# Patient Record
Sex: Male | Born: 1965 | Race: White | Hispanic: No | Marital: Single | Smoking: Current every day smoker
Health system: Southern US, Community
[De-identification: ages and names within clinical notes are randomized; demographics above are authoritative.]

## PROBLEM LIST (undated history)

## (undated) DIAGNOSIS — I82409 Acute embolism and thrombosis of unspecified deep veins of unspecified lower extremity: Secondary | ICD-10-CM

## (undated) DIAGNOSIS — I2699 Other pulmonary embolism without acute cor pulmonale: Secondary | ICD-10-CM

## (undated) DIAGNOSIS — I82441 Acute embolism and thrombosis of right tibial vein: Secondary | ICD-10-CM

## (undated) DIAGNOSIS — K5792 Diverticulitis of intestine, part unspecified, without perforation or abscess without bleeding: Secondary | ICD-10-CM

## (undated) DIAGNOSIS — R079 Chest pain, unspecified: Secondary | ICD-10-CM

## (undated) HISTORY — DX: Diverticulitis of intestine, part unspecified, without perforation or abscess without bleeding: K57.92

## (undated) HISTORY — PX: KNEE SURGERY: SHX244

## (undated) HISTORY — PX: COLON SURGERY: SHX602

---
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
## 2009-08-13 ENCOUNTER — Emergency Department (HOSPITAL_BASED_OUTPATIENT_CLINIC_OR_DEPARTMENT_OTHER): Admission: EM | Admit: 2009-08-13 | Discharge: 2009-08-13 | Payer: Self-pay | Admitting: Emergency Medicine

## 2010-08-29 ENCOUNTER — Telehealth: Payer: Self-pay | Admitting: *Deleted

## 2010-08-29 NOTE — Telephone Encounter (Signed)
Pt calling to make appt for cpx with Dr. Margaretha Glassing he has not been seen in a while.  Pt stated he is going out of town and can not come in to be seen until next week.  Pt stated he also needs to be seen for left arm numbness related to nerve damage.  The patient experienced right side facial tension on Tuesday 08/28/10 and also what to have that looked at.  Consulted with triage nurse and was told to advise pt that his symptoms could be associated with stroke and that he should be seen immediately.  Pt declined stating he has to go out of town and can not come in until he returns.  Appt schedule for cpx labs on 09/03/10 and cpx on 09/06/10.

## 2010-09-03 ENCOUNTER — Other Ambulatory Visit: Payer: Self-pay

## 2010-09-06 ENCOUNTER — Encounter: Payer: Self-pay | Admitting: Internal Medicine

## 2010-09-19 ENCOUNTER — Other Ambulatory Visit: Payer: Self-pay

## 2010-10-01 ENCOUNTER — Encounter: Payer: Self-pay | Admitting: Internal Medicine

## 2010-10-01 ENCOUNTER — Telehealth: Payer: Self-pay

## 2010-10-01 DIAGNOSIS — Z0289 Encounter for other administrative examinations: Secondary | ICD-10-CM

## 2010-10-01 NOTE — Telephone Encounter (Signed)
OK to charge for NS

## 2010-10-01 NOTE — Telephone Encounter (Signed)
noted 

## 2010-10-01 NOTE — Telephone Encounter (Signed)
NO CALL NO SHOW for a cpx - that was r/s by pt in June -. I dont see any records in centricity or epic as to when he was last seen by Dr. Amador Cunas. Please advise if he needs to be placed on no reschedule list.

## 2011-11-13 ENCOUNTER — Encounter: Payer: Self-pay | Admitting: Internal Medicine

## 2012-07-13 ENCOUNTER — Encounter: Payer: Self-pay | Admitting: Internal Medicine

## 2012-07-13 ENCOUNTER — Ambulatory Visit (INDEPENDENT_AMBULATORY_CARE_PROVIDER_SITE_OTHER): Payer: BC Managed Care – PPO | Admitting: Internal Medicine

## 2012-07-13 VITALS — BP 156/90 | HR 86 | Temp 98.6°F | Resp 20 | Ht 70.0 in | Wt 233.0 lb

## 2012-07-13 DIAGNOSIS — G5620 Lesion of ulnar nerve, unspecified upper limb: Secondary | ICD-10-CM

## 2012-07-13 DIAGNOSIS — Z720 Tobacco use: Secondary | ICD-10-CM

## 2012-07-13 DIAGNOSIS — G5622 Lesion of ulnar nerve, left upper limb: Secondary | ICD-10-CM

## 2012-07-13 DIAGNOSIS — G5600 Carpal tunnel syndrome, unspecified upper limb: Secondary | ICD-10-CM | POA: Insufficient documentation

## 2012-07-13 DIAGNOSIS — Z0000 Encounter for general adult medical examination without abnormal findings: Secondary | ICD-10-CM

## 2012-07-13 DIAGNOSIS — K148 Other diseases of tongue: Secondary | ICD-10-CM

## 2012-07-13 DIAGNOSIS — F17200 Nicotine dependence, unspecified, uncomplicated: Secondary | ICD-10-CM

## 2012-07-13 DIAGNOSIS — K5730 Diverticulosis of large intestine without perforation or abscess without bleeding: Secondary | ICD-10-CM | POA: Insufficient documentation

## 2012-07-13 LAB — CBC WITH DIFFERENTIAL/PLATELET
Basophils Absolute: 0 10*3/uL (ref 0.0–0.1)
Eosinophils Absolute: 0.1 10*3/uL (ref 0.0–0.7)
Hemoglobin: 13.9 g/dL (ref 13.0–17.0)
Lymphocytes Relative: 37.2 % (ref 12.0–46.0)
MCHC: 33.1 g/dL (ref 30.0–36.0)
Neutro Abs: 5.5 10*3/uL (ref 1.4–7.7)
Platelets: 288 10*3/uL (ref 150.0–400.0)
RDW: 13.6 % (ref 11.5–14.6)

## 2012-07-13 LAB — LIPID PANEL
Total CHOL/HDL Ratio: 7
VLDL: 44.8 mg/dL — ABNORMAL HIGH (ref 0.0–40.0)

## 2012-07-13 LAB — COMPREHENSIVE METABOLIC PANEL
ALT: 36 U/L (ref 0–53)
AST: 25 U/L (ref 0–37)
Albumin: 4.1 g/dL (ref 3.5–5.2)
Calcium: 8.8 mg/dL (ref 8.4–10.5)
Chloride: 105 mEq/L (ref 96–112)
Potassium: 3.8 mEq/L (ref 3.5–5.1)
Sodium: 138 mEq/L (ref 135–145)
Total Protein: 6.8 g/dL (ref 6.0–8.3)

## 2012-07-13 NOTE — Progress Notes (Signed)
Subjective:    Patient ID: Derek Stevenson, male    DOB: [birth year], 47 y.o.   MRN: [id number]  HPI 47 year old patient who is seen today to reestablish with our practice. His chief complaint is positional vertigo that has been problematic for the past several days. He also describes a numbness involving the ulnar distribution of his left hand for the past 3 years there's been no loss of grip strength. He also describes a some occasional discomfort and paresthesias involving the entire left arm. He also describes symptoms compatible with carpal tunnel syndrome when he frequently awakes from sleep with numbness involving both hands. This has been a problem for approximately 6 years. He's also been evaluated by oral surgery for a lesion involving his tongue is been there for approximately 4 years. He has a 20 year smoking history one half pack per day. He describes increasing stress and lack of energy.  Family history. Pants are followed in this practice father has history of diabetes hypertension and cerebrovascular disease. Social history divorced; IT ; one half pack per day smoker  History reviewed. No pertinent past medical history.  History   Social History  . Marital Status: Single    Spouse Name: N/A    Number of Children: N/A  . Years of Education: N/A   Occupational History  . Not on file.   Social History Main Topics  . Smoking status: Current Every Day Smoker    Types: Cigarettes  . Smokeless tobacco: Never Used  . Alcohol Use: 9.0 oz/week    15 Shots of liquor per week  . Drug Use: No  . Sexually Active: Not on file   Other Topics Concern  . Not on file   Social History Narrative  . No narrative on file    History reviewed. No pertinent past surgical history.  No family history on file.  Allergies  Allergen Reactions  . Penicillins Other (See Comments)    Unknown at birth    No current outpatient prescriptions on file prior to visit.   No current  facility-administered medications on file prior to visit.    BP 156/90  Pulse 86  Temp(Src) 98.6 F (37 C) (Oral)  Resp 20  Ht 5\' 10"  (1.778 m)  Wt 233 lb (105.688 kg)  BMI 33.43 kg/m2  SpO2 97%       Review of Systems  Constitutional: Positive for fatigue. Negative for fever, chills, activity change and appetite change.  HENT: Negative for hearing loss, ear pain, congestion, rhinorrhea, sneezing, mouth sores, trouble swallowing, neck pain, neck stiffness, dental problem, voice change, sinus pressure and tinnitus.   Eyes: Negative for photophobia, pain, redness and visual disturbance.  Respiratory: Negative for apnea, cough, choking, chest tightness, shortness of breath and wheezing.   Cardiovascular: Negative for chest pain, palpitations and leg swelling.  Gastrointestinal: Negative for nausea, vomiting, abdominal pain, diarrhea, constipation, blood in stool, abdominal distention, anal bleeding and rectal pain.  Genitourinary: Negative for dysuria, urgency, frequency, hematuria, flank pain, decreased urine volume, discharge, penile swelling, scrotal swelling, difficulty urinating, genital sores and testicular pain.  Musculoskeletal: Negative for myalgias, back pain, joint swelling, arthralgias and gait problem.  Skin: Negative for color change, rash and wound.  Neurological: Positive for dizziness and numbness. Negative for tremors, seizures, syncope, facial asymmetry, speech difficulty, weakness, light-headedness and headaches.  Hematological: Negative for adenopathy. Does not bruise/bleed easily.  Psychiatric/Behavioral: Negative for suicidal ideas, hallucinations, behavioral problems, confusion, sleep disturbance, self-injury, dysphoric mood, decreased concentration and  agitation. The patient is nervous/anxious.        Objective:   Physical Exam  Constitutional: He appears well-developed and well-nourished.  HENT:  Head: Normocephalic and atraumatic.  Right Ear: External  ear normal.  Left Ear: External ear normal.  Nose: Nose normal.  Mouth/Throat: Oropharynx is clear and moist.  Eyes: Conjunctivae and EOM are normal. Pupils are equal, round, and reactive to light. No scleral icterus.  Neck: Normal range of motion. Neck supple. No JVD present. No thyromegaly present.  Cardiovascular: Regular rhythm, normal heart sounds and intact distal pulses.  Exam reveals no gallop and no friction rub.   No murmur heard. Pulmonary/Chest: Effort normal and breath sounds normal. He exhibits no tenderness.  Abdominal: Soft. Bowel sounds are normal. He exhibits no distension and no mass. There is no tenderness.  Genitourinary: Prostate normal and penis normal.  Musculoskeletal: Normal range of motion. He exhibits no edema and no tenderness.  Lymphadenopathy:    He has no cervical adenopathy.  Neurological: He is alert. He has normal reflexes. No cranial nerve deficit. Coordination normal.  Numbness involving the left fifth finger and lateral aspect of the left fourth finger  Negative Tinel's  Skin: Skin is warm and dry. No rash noted.  Psychiatric: He has a normal mood and affect. His behavior is normal.          Assessment & Plan:   Benign positional vertigo. Information dispensed as well as repositioning exercises Tobacco abuse Fatigue/situational stress. Laboratory studies will be obtained Numbness left hand in the ulnar distribution; possible cervical radiculopathy Probable bilateral carpal tunnel syndrome Exogenous obesity

## 2012-07-13 NOTE — Patient Instructions (Signed)
Smoking tobacco is very bad for your health. You should stop smoking immediately.    It is important that you exercise regularly, at least 20 minutes 3 to 4 times per week.  If you develop chest pain or shortness of breath seek  medical attention.  You need to lose weight.  Consider a lower calorie diet and regular exercise.  Benign Positional Vertigo Vertigo means you feel like you or your surroundings are moving when they are not. Benign positional vertigo is the most common form of vertigo. Benign means that the cause of your condition is not serious. Benign positional vertigo is more common in older adults. CAUSES  Benign positional vertigo is the result of an upset in the labyrinth system. This is an area in the middle ear that helps control your balance. This may be caused by a viral infection, head injury, or repetitive motion. However, often no specific cause is found. SYMPTOMS  Symptoms of benign positional vertigo occur when you move your head or eyes in different directions. Some of the symptoms may include:  Loss of balance and falls.  Vomiting.  Blurred vision.  Dizziness.  Nausea.  Involuntary eye movements (nystagmus). DIAGNOSIS  Benign positional vertigo is usually diagnosed by physical exam. If the specific cause of your benign positional vertigo is unknown, your caregiver may perform imaging tests, such as magnetic resonance imaging (MRI) or computed tomography (CT). TREATMENT  Your caregiver may recommend movements or procedures to correct the benign positional vertigo. Medicines such as meclizine, benzodiazepines, and medicines for nausea may be used to treat your symptoms. In rare cases, if your symptoms are caused by certain conditions that affect the inner ear, you may need surgery. HOME CARE INSTRUCTIONS   Follow your caregiver's instructions.  Move slowly. Do not make sudden body or head movements.  Avoid driving.  Avoid operating heavy machinery.  Avoid  performing any tasks that would be dangerous to you or others during a vertigo episode.  Drink enough fluids to keep your urine clear or pale yellow. SEEK IMMEDIATE MEDICAL CARE IF:   You develop problems with walking, weakness, numbness, or using your arms, hands, or legs.  You have difficulty speaking.  You develop severe headaches.  Your nausea or vomiting continues or gets worse.  You develop visual changes.  Your family or friends notice any behavioral changes.  Your condition gets worse.  You have a fever.  You develop a stiff neck or sensitivity to light. MAKE SURE YOU:   Understand these instructions.  Will watch your condition.  Will get help right away if you are not doing well or get worse. Document Released: 12/17/2005 Document Revised: 06/03/2011 Document Reviewed: 11/29/2010 [facility] Patient Information 2013 [place], [place]. Carpal Tunnel Syndrome You may have carpal tunnel syndrome. This is a common condition. Carpal tunnel syndrome occurs when the tendons, bones, or ligaments in the wrist press against the median nerve as it passes into the hand.  Symptoms can include:  Intermittent numbness.   Pain or a tingling sensation in thumb and first two fingers.  The pain may radiate up to the shoulder. There may even be weakness in the hand muscles. The pain is often worse at night and in the early morning. Nerve conduction tests may be used to prove the diagnosis. Carpal tunnel syndrome is most often due to repeated movements of the hand or wrist. Other causes can include:  Prior injuries.   Diabetes.   Obesity.   Smoking.   Pregnancy. Symptoms  that develop during pregnancy often stop when the pregnancy is over.  Treatment includes:  Splinting - A wrist splint helps prevent movements that irritate the nerve. Splints are especially helpful at night when the symptoms are often worse.   Ice packs - Cold packs applied to the palm side of the wrist for 20  minutes every 2 hours while awake may give some relief.   Medication - Medicine to reduce inflammation and pain are often used. Cortisone injections around the nerve may also bring improvement.  Severe cases of carpal tunnel syndrome can require surgery to relieve the pressure on the nerve. This may be necessary if there is evidence of weakness or decreased sensation in your hand, or if your symptoms do not improve with conservative treatment. See your caregiver for follow-up to be certain your condition is improving. Document Released: 04/18/2004 Document Revised: 11/21/2010 Document Reviewed: 01/15/2007 [facility] Patient Information 2012 [place], [place].

## 2012-07-14 ENCOUNTER — Ambulatory Visit: Payer: Self-pay | Admitting: Internal Medicine

## 2012-08-05 ENCOUNTER — Telehealth: Payer: Self-pay | Admitting: Internal Medicine

## 2012-08-05 NOTE — Telephone Encounter (Signed)
Patient called stating that he would like a call with results. Please assist.

## 2012-08-06 NOTE — Telephone Encounter (Signed)
Left message on voicemail to call office.  

## 2012-08-06 NOTE — Telephone Encounter (Signed)
All okay except the TSH slightly suppressed.  We'll follow next visit. Cholesterol 184

## 2012-08-07 NOTE — Telephone Encounter (Signed)
Left detailed message labs are okay except TSH, thyroid slightly suppressed will discuss at follow up visit and Cholesterol was 184 per Dr.Kwiatkowski. Any questions call back.

## 2012-08-13 ENCOUNTER — Encounter: Payer: Self-pay | Admitting: Internal Medicine

## 2015-03-26 DIAGNOSIS — I82409 Acute embolism and thrombosis of unspecified deep veins of unspecified lower extremity: Secondary | ICD-10-CM

## 2015-03-26 DIAGNOSIS — I2699 Other pulmonary embolism without acute cor pulmonale: Secondary | ICD-10-CM

## 2015-03-26 HISTORY — DX: Other pulmonary embolism without acute cor pulmonale: I26.99

## 2015-03-26 HISTORY — DX: Acute embolism and thrombosis of unspecified deep veins of unspecified lower extremity: I82.409

## 2015-04-01 LAB — POCT INR: INR: 1.7

## 2015-10-24 DIAGNOSIS — S82141A Displaced bicondylar fracture of right tibia, initial encounter for closed fracture: Secondary | ICD-10-CM | POA: Insufficient documentation

## 2015-10-25 DIAGNOSIS — I2699 Other pulmonary embolism without acute cor pulmonale: Secondary | ICD-10-CM | POA: Insufficient documentation

## 2015-10-25 DIAGNOSIS — I82441 Acute embolism and thrombosis of right tibial vein: Secondary | ICD-10-CM | POA: Insufficient documentation

## 2015-10-25 HISTORY — DX: Other pulmonary embolism without acute cor pulmonale: I26.99

## 2015-10-25 HISTORY — DX: Acute embolism and thrombosis of right tibial vein: I82.441

## 2015-10-30 ENCOUNTER — Telehealth: Payer: Self-pay | Admitting: General Practice

## 2015-10-30 NOTE — Telephone Encounter (Signed)
Okay 
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
## 2015-10-31 NOTE — Telephone Encounter (Signed)
Pt has been scheduled.  °

## 2015-11-01 ENCOUNTER — Ambulatory Visit: Payer: Self-pay | Admitting: Adult Health

## 2015-11-02 ENCOUNTER — Emergency Department (HOSPITAL_COMMUNITY): Payer: BLUE CROSS/BLUE SHIELD

## 2015-11-02 ENCOUNTER — Encounter (HOSPITAL_COMMUNITY): Payer: Self-pay | Admitting: Emergency Medicine

## 2015-11-02 ENCOUNTER — Emergency Department (HOSPITAL_COMMUNITY)
Admission: EM | Admit: 2015-11-02 | Discharge: 2015-11-03 | Disposition: A | Discharge status: Home / Self Care | Payer: BLUE CROSS/BLUE SHIELD | Attending: Emergency Medicine | Admitting: Emergency Medicine

## 2015-11-02 DIAGNOSIS — R0600 Dyspnea, unspecified: Secondary | ICD-10-CM | POA: Insufficient documentation

## 2015-11-02 DIAGNOSIS — Z7901 Long term (current) use of anticoagulants: Secondary | ICD-10-CM | POA: Insufficient documentation

## 2015-11-02 DIAGNOSIS — F17210 Nicotine dependence, cigarettes, uncomplicated: Secondary | ICD-10-CM | POA: Insufficient documentation

## 2015-11-02 DIAGNOSIS — R066 Hiccough: Secondary | ICD-10-CM | POA: Insufficient documentation

## 2015-11-02 DIAGNOSIS — R0602 Shortness of breath: Secondary | ICD-10-CM | POA: Diagnosis present
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
## 2015-11-02 NOTE — ED Triage Notes (Signed)
Pt from home with c/o shortness of breat and hiccups x 1 week. [facility] Per EMS, pt has confirmed PE and DVT. Taking coumadin and lovenox, has not taken today. BP-137/81, HR-90, SpO2-97% ra

## 2015-11-03 ENCOUNTER — Encounter: Payer: Self-pay | Admitting: Internal Medicine

## 2015-11-03 ENCOUNTER — Ambulatory Visit (INDEPENDENT_AMBULATORY_CARE_PROVIDER_SITE_OTHER): Payer: BLUE CROSS/BLUE SHIELD | Admitting: Internal Medicine

## 2015-11-03 DIAGNOSIS — S82141A Displaced bicondylar fracture of right tibia, initial encounter for closed fracture: Secondary | ICD-10-CM

## 2015-11-03 DIAGNOSIS — S82141S Displaced bicondylar fracture of right tibia, sequela: Secondary | ICD-10-CM

## 2015-11-03 DIAGNOSIS — S82191S Other fracture of upper end of right tibia, sequela: Secondary | ICD-10-CM | POA: Diagnosis not present

## 2015-11-03 DIAGNOSIS — R799 Abnormal finding of blood chemistry, unspecified: Secondary | ICD-10-CM

## 2015-11-03 DIAGNOSIS — R739 Hyperglycemia, unspecified: Secondary | ICD-10-CM

## 2015-11-03 DIAGNOSIS — I2699 Other pulmonary embolism without acute cor pulmonale: Secondary | ICD-10-CM

## 2015-11-03 LAB — PROTIME-INR
INR: 1.27
PROTHROMBIN TIME: 16 s — AB (ref 11.4–15.2)

## 2015-11-03 LAB — TROPONIN I

## 2015-11-03 MED ORDER — METOCLOPRAMIDE HCL 5 MG/ML IJ SOLN
10.0000 mg | Freq: Once | INTRAMUSCULAR | Status: AC
  Administered 2015-11-03: 10 mg via INTRAVENOUS
  Filled 2015-11-03: qty 2

## 2015-11-03 MED ORDER — ENOXAPARIN SODIUM 100 MG/ML ~~LOC~~ SOLN
1.0000 mg/kg | Freq: Once | SUBCUTANEOUS | Status: AC
  Administered 2015-11-03: 100 mg via SUBCUTANEOUS
  Filled 2015-11-03: qty 1

## 2015-11-03 MED ORDER — DIPHENHYDRAMINE HCL 50 MG/ML IJ SOLN
25.0000 mg | Freq: Once | INTRAMUSCULAR | Status: AC
  Administered 2015-11-03: 25 mg via INTRAVENOUS
  Filled 2015-11-03: qty 1

## 2015-11-03 MED ORDER — ONDANSETRON 4 MG PO TBDP
8.0000 mg | ORAL_TABLET | Freq: Once | ORAL | Status: AC
  Administered 2015-11-03: 8 mg via ORAL
  Filled 2015-11-03: qty 2

## 2015-11-03 MED ORDER — METOCLOPRAMIDE HCL 10 MG PO TABS: 10.0000 mg | ORAL_TABLET | Freq: Three times a day (TID) | ORAL | 0 refills | Status: DC | PRN
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
## 2015-11-03 NOTE — ED Notes (Addendum)
Pt c/o hiccups at this time. Dr. Bebe ShaggyWickline notified.

## 2015-11-03 NOTE — Patient Instructions (Signed)
Continue Lovenox injections twice daily Coumadin 7.5 milligrams today (1 and a half tablets), then resume 5 mg daily  Coumadin clinic on Wednesday of next week  Return in one month for follow-up  Orthopedic follow-up as scheduled

## 2015-11-08 ENCOUNTER — Ambulatory Visit (INDEPENDENT_AMBULATORY_CARE_PROVIDER_SITE_OTHER): Payer: BLUE CROSS/BLUE SHIELD | Admitting: General Practice

## 2015-11-08 DIAGNOSIS — Z7901 Long term (current) use of anticoagulants: Secondary | ICD-10-CM

## 2015-11-08 DIAGNOSIS — Z5181 Encounter for therapeutic drug level monitoring: Secondary | ICD-10-CM | POA: Insufficient documentation

## 2015-11-08 LAB — POCT INR: INR: 2

## 2015-11-08 NOTE — Patient Instructions (Signed)
Pre visit review using our clinic review tool, if applicable. No additional management support is needed unless otherwise documented below in the visit note. A full discussion of the nature of anticoagulants has been carried out.  A benefit risk analysis has been presented to the patient, so that they understand the justification for choosing anticoagulation at this time. The need for frequent and regular monitoring, precise dosage adjustment and compliance is stressed.  Side effects of potential bleeding are discussed.  The patient should avoid any OTC items containing aspirin or ibuprofen, and should avoid great swings in general diet.  Avoid alcohol consumption.  Call if any signs of abnormal bleeding.    

## 2015-11-15 ENCOUNTER — Ambulatory Visit (INDEPENDENT_AMBULATORY_CARE_PROVIDER_SITE_OTHER): Payer: BLUE CROSS/BLUE SHIELD | Admitting: General Practice

## 2015-11-15 DIAGNOSIS — Z5181 Encounter for therapeutic drug level monitoring: Secondary | ICD-10-CM | POA: Diagnosis not present

## 2015-11-15 DIAGNOSIS — Z7901 Long term (current) use of anticoagulants: Secondary | ICD-10-CM | POA: Diagnosis not present

## 2015-11-22 ENCOUNTER — Telehealth: Payer: Self-pay | Admitting: Internal Medicine

## 2015-11-22 MED ORDER — WARFARIN SODIUM 5 MG PO TABS: 5.0000 mg | ORAL_TABLET | Freq: Every day | ORAL | 0 refills | Status: DC

## 2015-11-22 NOTE — Telephone Encounter (Signed)
Spoke to pt, told him Rx refill for Coumadin was sent to pharmacy. Please follow directions Coumadin Clinic gave you and when you have your next appt please tell [facility] you need refills. Pt verbalized understanding.

## 2015-11-22 NOTE — Telephone Encounter (Signed)
Researched pt's last provider visit on 8/11 and [facility] and pt is to continue Coumadin as directed. Pt does have follow up appointments. Rx refill for Coumadin sent to pharmacy.

## 2015-11-22 NOTE — Telephone Encounter (Signed)
Pt needs refill on coumadin 5 mg #30 send to [facility] [address]. This med was originally prescribed by md at [facility]

## 2015-11-29 ENCOUNTER — Ambulatory Visit: Payer: BLUE CROSS/BLUE SHIELD | Admitting: General Practice

## 2015-11-29 DIAGNOSIS — Z5181 Encounter for therapeutic drug level monitoring: Secondary | ICD-10-CM

## 2015-11-29 DIAGNOSIS — Z7901 Long term (current) use of anticoagulants: Secondary | ICD-10-CM

## 2015-11-29 LAB — POCT INR: INR: 1.8

## 2015-12-01 ENCOUNTER — Ambulatory Visit (INDEPENDENT_AMBULATORY_CARE_PROVIDER_SITE_OTHER): Payer: BLUE CROSS/BLUE SHIELD | Admitting: Internal Medicine

## 2015-12-01 ENCOUNTER — Encounter: Payer: Self-pay | Admitting: Internal Medicine

## 2015-12-01 DIAGNOSIS — S82141D Displaced bicondylar fracture of right tibia, subsequent encounter for closed fracture with routine healing: Secondary | ICD-10-CM

## 2015-12-01 DIAGNOSIS — I2699 Other pulmonary embolism without acute cor pulmonale: Secondary | ICD-10-CM

## 2015-12-01 DIAGNOSIS — R799 Abnormal finding of blood chemistry, unspecified: Secondary | ICD-10-CM

## 2015-12-01 DIAGNOSIS — S82191D Other fracture of upper end of right tibia, subsequent encounter for closed fracture with routine healing: Secondary | ICD-10-CM

## 2015-12-01 DIAGNOSIS — R739 Hyperglycemia, unspecified: Secondary | ICD-10-CM

## 2015-12-01 LAB — POCT GLUCOSE (DEVICE FOR HOME USE): POC GLUCOSE: 127 mg/dL — AB (ref 70–99)
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
## 2015-12-01 NOTE — Progress Notes (Signed)
Pre visit review using our clinic review tool, if applicable. No additional management support is needed unless otherwise documented below in the visit note. 

## 2015-12-01 NOTE — Patient Instructions (Signed)
Return in 2 months for follow-up   Continue to monitor prothrombin time/ INR and continue Coumadin anticoagulation

## 2015-12-01 NOTE — Addendum Note (Signed)
Addended by: Jimmye NormanPHANOS, Diora Bellizzi J on: 12/01/2015 05:05 PM   Modules accepted: Orders

## 2015-12-18 ENCOUNTER — Other Ambulatory Visit: Payer: Self-pay | Admitting: Internal Medicine

## 2015-12-19 ENCOUNTER — Other Ambulatory Visit: Payer: Self-pay | Admitting: General Practice

## 2015-12-19 MED ORDER — WARFARIN SODIUM 5 MG PO TABS: 5.0000 mg | ORAL_TABLET | Freq: Every day | ORAL | 3 refills | Status: DC

## 2015-12-19 NOTE — Telephone Encounter (Signed)
Pt is out of his  warfarin (COUMADIN) 5 MG tablet  [facility]/ [address]  Pt has coum appt on wed (tomorrow) Please advise.

## 2015-12-20 ENCOUNTER — Ambulatory Visit (INDEPENDENT_AMBULATORY_CARE_PROVIDER_SITE_OTHER): Payer: BLUE CROSS/BLUE SHIELD | Admitting: General Practice

## 2015-12-20 DIAGNOSIS — Z7901 Long term (current) use of anticoagulants: Secondary | ICD-10-CM | POA: Diagnosis not present

## 2015-12-20 DIAGNOSIS — Z5181 Encounter for therapeutic drug level monitoring: Secondary | ICD-10-CM

## 2015-12-20 LAB — POCT INR: INR: 2.4

## 2016-01-17 ENCOUNTER — Ambulatory Visit (INDEPENDENT_AMBULATORY_CARE_PROVIDER_SITE_OTHER): Payer: BLUE CROSS/BLUE SHIELD | Admitting: General Practice

## 2016-01-17 DIAGNOSIS — Z5181 Encounter for therapeutic drug level monitoring: Secondary | ICD-10-CM

## 2016-01-17 LAB — POCT INR: INR: 1.9

## 2016-01-17 NOTE — Patient Instructions (Signed)
Pre visit review using our clinic review tool, if applicable. No additional management support is needed unless otherwise documented below in the visit note. 

## 2016-02-02 ENCOUNTER — Ambulatory Visit: Payer: BLUE CROSS/BLUE SHIELD | Admitting: Internal Medicine

## 2016-02-14 ENCOUNTER — Ambulatory Visit: Payer: BLUE CROSS/BLUE SHIELD

## 2016-02-14 ENCOUNTER — Ambulatory Visit (INDEPENDENT_AMBULATORY_CARE_PROVIDER_SITE_OTHER): Payer: BLUE CROSS/BLUE SHIELD | Admitting: General Practice

## 2016-02-14 DIAGNOSIS — Z5181 Encounter for therapeutic drug level monitoring: Secondary | ICD-10-CM

## 2016-02-14 LAB — POCT INR: INR: 1.3

## 2016-02-14 NOTE — Patient Instructions (Signed)
Pre visit review using our clinic review tool, if applicable. No additional management support is needed unless otherwise documented below in the visit note. 

## 2016-02-19 ENCOUNTER — Ambulatory Visit: Payer: Self-pay | Admitting: Internal Medicine

## 2016-02-19 DIAGNOSIS — Z0289 Encounter for other administrative examinations: Secondary | ICD-10-CM

## 2016-05-02 ENCOUNTER — Ambulatory Visit: Payer: Self-pay | Admitting: General Practice

## 2016-06-25 ENCOUNTER — Ambulatory Visit: Payer: BLUE CROSS/BLUE SHIELD | Admitting: Internal Medicine

## 2016-10-10 ENCOUNTER — Ambulatory Visit: Payer: BLUE CROSS/BLUE SHIELD | Admitting: Family Medicine

## 2016-10-16 ENCOUNTER — Ambulatory Visit: Payer: BLUE CROSS/BLUE SHIELD | Admitting: Internal Medicine

## 2017-09-02 IMAGING — CR DG CHEST 2V
2 series · 2 of 2 positions shown · non-contrast
Comparison: None.

CLINICAL DATA: Acute onset of shortness of breath and hiccups.
Initial encounter.

EXAM:
CHEST  2 VIEW

[chest lat]
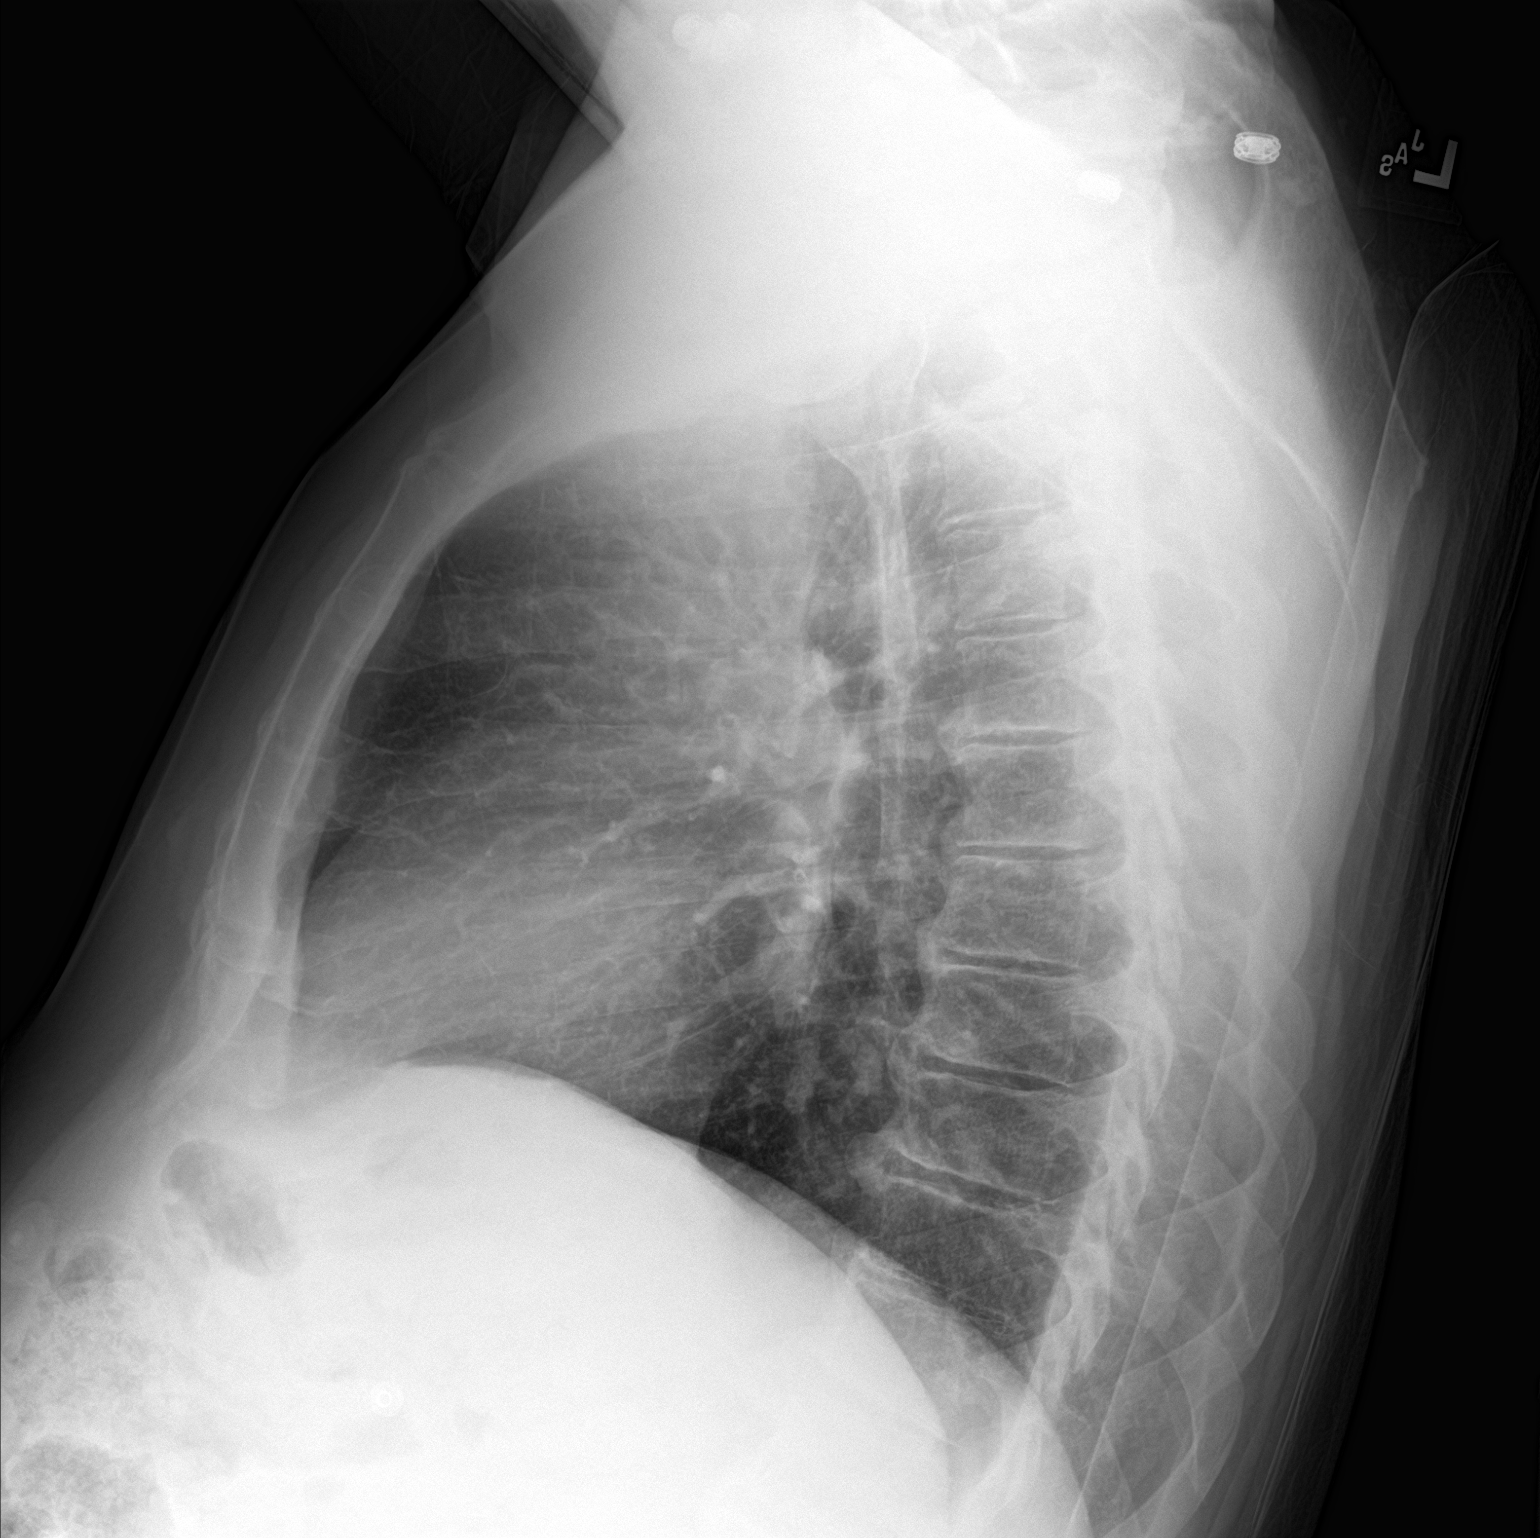

[chest ap]
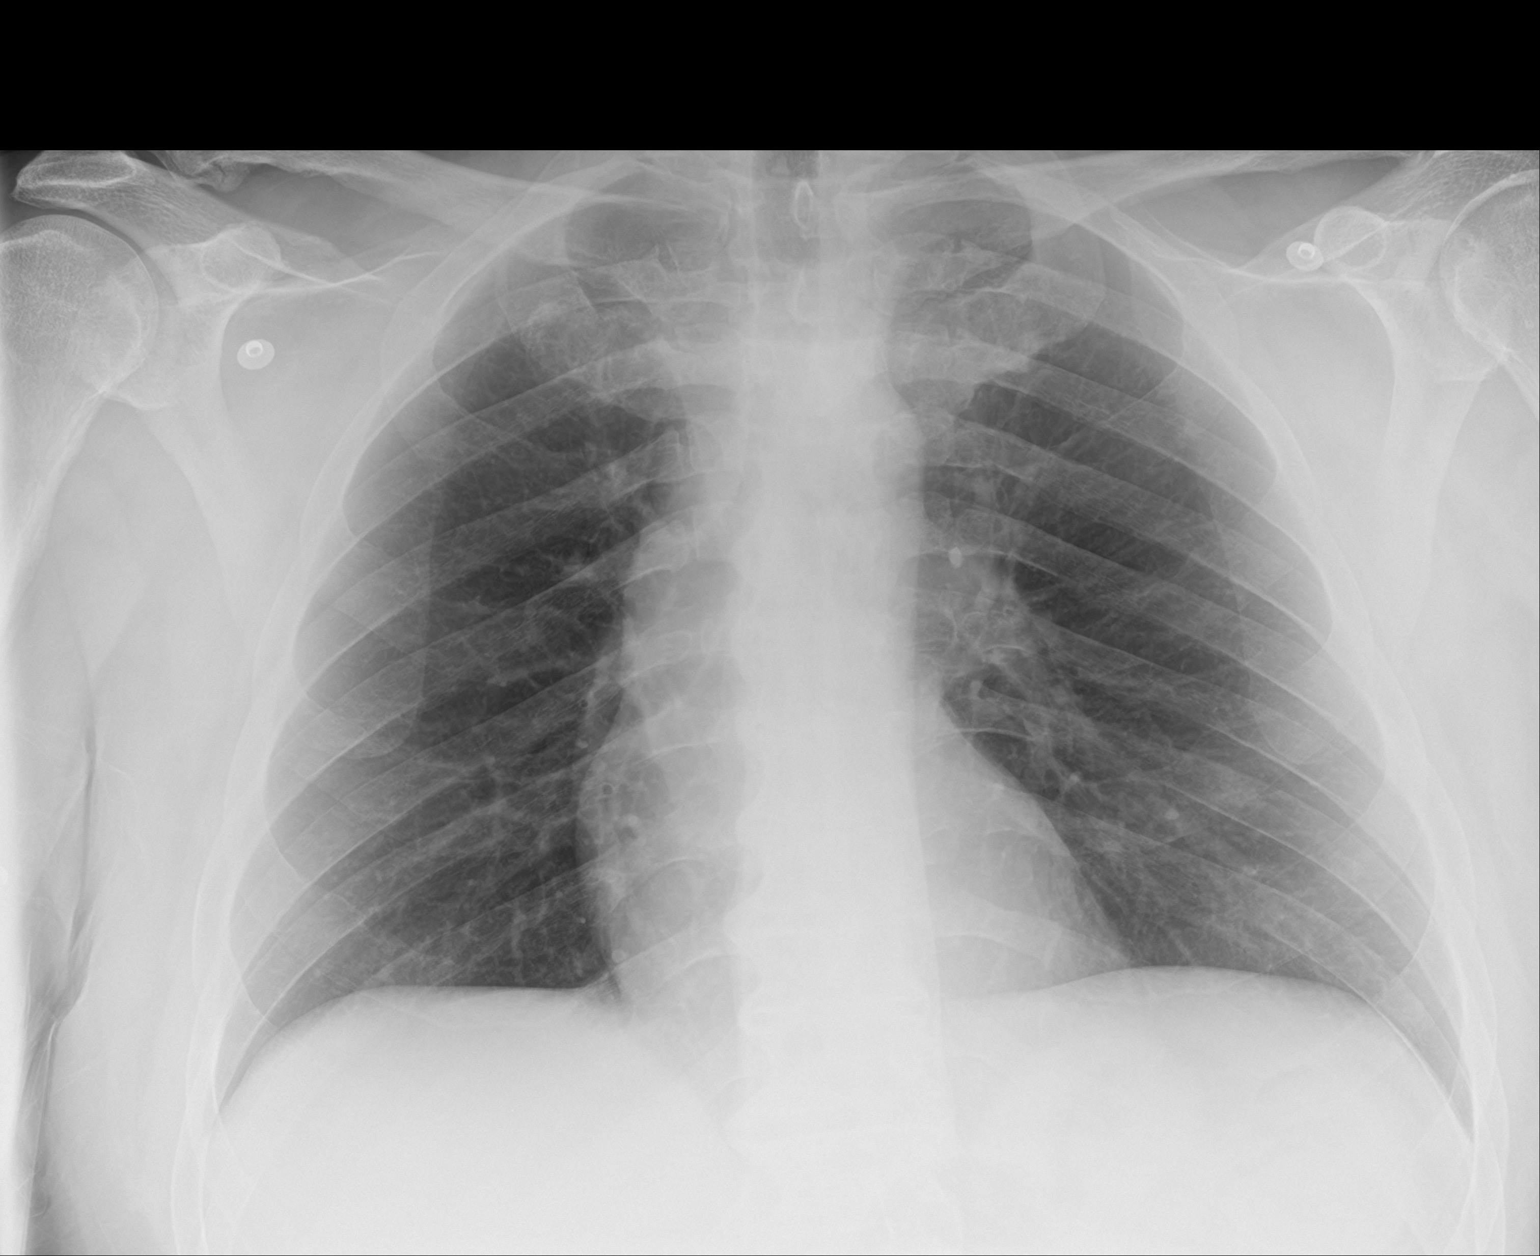

[2 of 2 positions shown; findings below may reference images not displayed]

FINDINGS: The lungs are well-aerated and clear. There is no evidence of focal
opacification, pleural effusion or pneumothorax.

The heart is normal in size; the mediastinal contour is within
normal limits. No acute osseous abnormalities are seen.
IMPRESSION: No acute cardiopulmonary process seen.

## 2018-12-14 ENCOUNTER — Other Ambulatory Visit: Payer: Self-pay

## 2018-12-14 ENCOUNTER — Encounter: Payer: Self-pay | Admitting: Medical

## 2018-12-14 ENCOUNTER — Ambulatory Visit: Payer: Managed Care, Other (non HMO) | Admitting: Medical

## 2018-12-14 ENCOUNTER — Telehealth: Payer: Self-pay | Admitting: Medical

## 2018-12-14 VITALS — BP 150/96 | HR 66 | Temp 98.1°F | Ht 71.0 in | Wt 211.6 lb

## 2018-12-14 DIAGNOSIS — R0789 Other chest pain: Secondary | ICD-10-CM | POA: Diagnosis not present

## 2018-12-14 DIAGNOSIS — R002 Palpitations: Secondary | ICD-10-CM | POA: Diagnosis not present

## 2018-12-14 DIAGNOSIS — F17200 Nicotine dependence, unspecified, uncomplicated: Secondary | ICD-10-CM | POA: Diagnosis not present

## 2018-12-14 DIAGNOSIS — I10 Essential (primary) hypertension: Secondary | ICD-10-CM

## 2018-12-14 DIAGNOSIS — R51 Headache: Secondary | ICD-10-CM | POA: Diagnosis not present

## 2018-12-14 DIAGNOSIS — R519 Headache, unspecified: Secondary | ICD-10-CM

## 2018-12-14 DIAGNOSIS — Z86718 Personal history of other venous thrombosis and embolism: Secondary | ICD-10-CM

## 2018-12-14 DIAGNOSIS — Z86711 Personal history of pulmonary embolism: Secondary | ICD-10-CM

## 2018-12-14 LAB — COMPREHENSIVE METABOLIC PANEL
ALT: 21 U/L (ref 0–53)
AST: 18 U/L (ref 0–37)
Albumin: 4.1 g/dL (ref 3.5–5.2)
Alkaline Phosphatase: 93 U/L (ref 39–117)
BUN: 12 mg/dL (ref 6–23)
CO2: 25 mEq/L (ref 19–32)
Calcium: 9.3 mg/dL (ref 8.4–10.5)
Chloride: 107 mEq/L (ref 96–112)
Creatinine, Ser: 0.78 mg/dL (ref 0.40–1.50)
GFR: 104.51 mL/min (ref >60.00)
Glucose, Bld: 89 mg/dL (ref 70–99)
Potassium: 4.4 mEq/L (ref 3.5–5.1)
Sodium: 139 mEq/L (ref 135–145)
Total Bilirubin: 0.2 mg/dL (ref 0.2–1.2)
Total Protein: 6.6 g/dL (ref 6.0–8.3)

## 2018-12-14 LAB — CBC WITH DIFFERENTIAL/PLATELET
Basophils Absolute: 0.1 10*3/uL (ref 0.0–0.1)
Basophils Relative: 0.8 % (ref 0.0–3.0)
Eosinophils Absolute: 0.1 10*3/uL (ref 0.0–0.7)
Eosinophils Relative: 0.7 % (ref 0.0–5.0)
HCT: 44.3 % (ref 39.0–52.0)
Hemoglobin: 14.5 g/dL (ref 13.0–17.0)
Lymphocytes Relative: 28 % (ref 12.0–46.0)
Lymphs Abs: 3.1 10*3/uL (ref 0.7–4.0)
MCHC: 32.9 g/dL (ref 30.0–36.0)
MCV: 90.7 fl (ref 78.0–100.0)
Monocytes Absolute: 0.9 10*3/uL (ref 0.1–1.0)
Monocytes Relative: 7.9 % (ref 3.0–12.0)
Neutro Abs: 7 10*3/uL (ref 1.4–7.7)
Neutrophils Relative %: 62.6 % (ref 43.0–77.0)
Platelets: 262 10*3/uL (ref 150.0–400.0)
RBC: 4.88 Mil/uL (ref 4.22–5.81)
RDW: 13.6 % (ref 11.5–15.5)
WBC: 11.2 10*3/uL — ABNORMAL HIGH (ref 4.0–10.5)

## 2018-12-14 LAB — LIPID PANEL
Cholesterol: 166 mg/dL (ref 0–200)
HDL: 32.1 mg/dL — ABNORMAL LOW (ref >39.00)
LDL Cholesterol: 114 mg/dL — ABNORMAL HIGH (ref 0–99)
NonHDL: 134.11
Total CHOL/HDL Ratio: 5
Triglycerides: 102 mg/dL (ref 0.0–149.0)
VLDL: 20.4 mg/dL (ref 0.0–40.0)

## 2018-12-14 LAB — TROPONIN I (HIGH SENSITIVITY): High Sens Troponin I: 5 ng/L (ref 2–17)

## 2018-12-14 MED ORDER — ATORVASTATIN CALCIUM 10 MG PO TABS: 10.0000 mg | ORAL_TABLET | Freq: Every day | ORAL | 3 refills | Status: AC

## 2018-12-14 MED ORDER — AMLODIPINE BESYLATE 5 MG PO TABS: 5.0000 mg | ORAL_TABLET | Freq: Every day | ORAL | 0 refills | Status: DC
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
## 2018-12-14 NOTE — Telephone Encounter (Signed)
Rx atorvastatin sent to pt pharmacy. 
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
## 2018-12-18 DIAGNOSIS — R079 Chest pain, unspecified: Secondary | ICD-10-CM

## 2018-12-18 HISTORY — DX: Chest pain, unspecified: R07.9

## 2018-12-19 DIAGNOSIS — I700 Atherosclerosis of aorta: Secondary | ICD-10-CM | POA: Insufficient documentation

## 2018-12-19 DIAGNOSIS — Z86718 Personal history of other venous thrombosis and embolism: Secondary | ICD-10-CM | POA: Insufficient documentation

## 2018-12-19 DIAGNOSIS — Z86711 Personal history of pulmonary embolism: Secondary | ICD-10-CM | POA: Insufficient documentation

## 2018-12-19 NOTE — Progress Notes (Signed)
Cardiology Office Note:    Date:  12/21/2018   ID:  Derek Stevenson, DOB Jan 14, 1966, MRN [id number]  PCP:  Mackie Pai, PA-C  Cardiologist:  Shirlee More, MD   Referring MD: Mackie Pai, PA-C  ASSESSMENT:    1. Chest pain in adult   2. Atherosclerosis of aorta ([place])   3. History of pulmonary embolism   4. History of deep vein thrombosis (DVT) of lower extremity   5. Dyslipidemia   6. Precordial pain   7. Essential hypertension    PLAN:    In order of problems listed above:  1. He is having typical angina walk-through pattern see under history refer for cardiac CTA medical therapy reassess in 6 weeks.  Stressed with him the importance of smoking cessation. 2. Medical therapy antihypertensives aspirin statin 3. Increased risk of DVT pulmonary embolism in the future, his episode was associated with a transient reversible factor and does not require indefinite anticoagulation 4. Dyslipidemia with hypo-HDL start a statin 5. Continue his non-rate limiting calcium channel blocker BP at target.  And again the results of cardiac CTA consider the addition of beta-blocker.  Next appointment 6 weeks   Medication Adjustments/Labs and Tests Ordered: Current medicines are reviewed at length with the patient today.  Concerns regarding medicines are outlined above.  Orders Placed This Encounter  Procedures  . CT CORONARY MORPH W/CTA COR W/SCORE W/CA W/CM &/OR WO/CM  . CT CORONARY FRACTIONAL FLOW RESERVE DATA PREP  . CT CORONARY FRACTIONAL FLOW RESERVE FLUID ANALYSIS   No orders of the defined types were placed in this encounter.    Chief Complaint  Patient presents with  . Chest Pain    History of Present Illness:    Derek Stevenson is a 53 y.o. male with hypertension and aortic atherosclerosis and a history of venous thromboembolism in August 2017 with acute deep vein thrombosis of the tibial vein right lower extremity with pulmonary embolism associated with lower extremity  trauma and fracture who is being seen today for the evaluation of chest pain at the request of Saguier, Percell Miller, PA-C.  I personally reviewed the EKG performed 12/14/2018 showing sinus rhythm and nonspecific T wave changes.  He has dyslipidemia with pattern of hypo-HDL and is on a statin. CTA performed 10/24/2015: FINDINGS: VASCULATURE: No aortic occlusion, dissection, or aneurysm. Filling defect within the segmental and subsegmental arteries within the right lower lobe (series 5 image 6 through image 100) representing acute pulmonary embolism. Pulmonary fat embolism is high on the differential given concomitant tibial plateau fracture.. Mild calcification of the abdominal aorta and bilateral iliac vessels without aneurysm.  For the last 1 to 1-1/2 years he has had a pattern of stable chest pain.  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  The symptoms have not progressed significantly and are not at rest or nocturnal.  He is concerned he has heart disease and went to see his primary care physician.  With his dyslipidemia was ordered a statin he has not started it yet and he started on an antihypertensive and BP is at target.  Scribes the chest pain is moderate intensity does not radiate no shortness of breath diaphoresis nausea or vomiting it is exertional in nature and he can walk through his angina.  We discussed options including traditional stress testing cardiac  CTA referral for coronary angiography and we shared decision making after reviewing benefits and risk he undergo cardiac CTA.  He has no dye allergy or renal disease.  I asked him to start aspirin 81 mg daily to go ahead and start on a statin with dyslipidemia low HDL and continue his antihypertensive.  I will plan to see back in the office in 6 months.  If he has high risk markers he  would benefit from revascularization otherwise medical treatment and smoking cessation.  He continues to smoke  Past Medical History:  Diagnosis Date  . Acute deep vein thrombosis (DVT) of tibial vein of right lower extremity ([facility]) 10/25/2015  . Chest pain 12/18/2018  . Diverticulitis    temporary colostomy 2001  . DVT (deep venous thrombosis) ([facility]) 2017   pt unsure of details  . PE (pulmonary thromboembolism) ([facility]) 10/25/2015   July 2017  . Pulmonary embolism and infarction [facility]) 2017   pt unsure of details    Past Surgical History:  Procedure Laterality Date  . COLON SURGERY    . KNEE SURGERY     "reconstructive"    Current Medications: Current Meds  Medication Sig  . amLODipine (NORVASC) 5 MG tablet Take 1 tablet (5 mg total) by mouth daily.  Marland Kitchen atorvastatin (LIPITOR) 10 MG tablet Take 1 tablet (10 mg total) by mouth daily.     Allergies:   Penicillins   Social History   Socioeconomic History  . Marital status: Single    Spouse name: Not on file  . Number of children: Not on file  . Years of education: Not on file  . Highest education level: Not on file  Occupational History  . Not on file  Social Needs  . Financial resource strain: Not on file  . Food insecurity    Worry: Not on file    Inability: Not on file  . Transportation needs    Medical: Not on file    Non-medical: Not on file  Tobacco Use  . Smoking status: Current Every Day Smoker    Packs/day: 0.50    Years: 15.00    Pack years: 7.50    Types: Cigarettes  . Smokeless tobacco: Never Used  Substance and Sexual Activity  . Alcohol use: Yes    Comment: 4 -5 drinks per week   . Drug use: No  . Sexual activity: Not on file  Lifestyle  . Physical activity    Days per week: Not on file    Minutes per session: Not on file  . Stress: Not on file  Relationships  . Social Musician on phone: Not on file    Gets together: Not on file    Attends religious service: Not on file    Active  member of club or organization: Not on file    Attends meetings of clubs or organizations: Not on file    Relationship status: Not on file  Other Topics Concern  . Not on file  Social History Narrative  . Not on file     Family History: The patient's family history includes Cancer in his maternal grandfather, maternal grandmother, paternal grandfather, and paternal grandmother; Heart attack in his mother; Heart disease in his father and mother; Heart failure in his father; Hypertension in his brother.  Is moderate tightness to the syndrome his father has heart failure with stage IV CKD  ROS:   Review of Systems  Constitution: Negative.  HENT: Negative.  Eyes: Negative.   Cardiovascular: Positive for chest pain.  Respiratory: Negative.   Endocrine: Negative.   Hematologic/Lymphatic: Negative.   Skin: Negative.   Musculoskeletal: Positive for joint pain.  Gastrointestinal: Negative.   Genitourinary: Negative.   Neurological: Positive for paresthesias.  Psychiatric/Behavioral: Negative.   Allergic/Immunologic: Negative.    Please see the history of present illness.    He has typical symptoms of carpal tunnel both hands Feels under great work and home stress caring for his father full-time  all other systems reviewed and are negative.  EKGs/Labs/Other Studies Reviewed:    The following studies were reviewed today:   EKG:  EKG 12/14/2018 is personally reviewed and demonstrates sinus rhythm nonspecific T waves  Recent Labs: 12/14/2018: ALT 21; BUN 12; Creatinine, Ser 0.78; Hemoglobin 14.5; Platelets 262.0; Potassium 4.4; Sodium 139  Recent Lipid Panel    Component Value Date/Time   CHOL 166 12/14/2018 1150   TRIG 102.0 12/14/2018 1150   HDL 32.10 (L) 12/14/2018 1150   CHOLHDL 5 12/14/2018 1150   VLDL 20.4 12/14/2018 1150   LDLCALC 114 (H) 12/14/2018 1150   LDLDIRECT 130.3 07/13/2012 1512    Physical Exam:    VS:  BP 128/78 (BP Location: Left Arm, Patient Position:  Sitting, Cuff Size: Normal)   Pulse 73   Temp (!) 97.5 F (36.4 C)   Ht 5\' 11"  (1.803 m)   Wt 212 lb 12.8 oz (96.5 kg)   SpO2 98%   BMI 29.68 kg/m     Wt Readings from Last 3 Encounters:  12/21/18 212 lb 12.8 oz (96.5 kg)  12/14/18 211 lb 9.6 oz (96 kg)  12/01/15 209 lb (94.8 kg)     GEN:  Well nourished, well developed in no acute distress no xanthoma or xanthelasma looks older than his age HEENT: Normal NECK: No JVD; No carotid bruits LYMPHATICS: No lymphadenopathy CARDIAC: RRR, no murmurs, rubs, gallops RESPIRATORY:  Clear to auscultation without rales, wheezing or rhonchi  ABDOMEN: Soft, non-tender, non-distended MUSCULOSKELETAL:  No edema; No deformity  SKIN: Warm and dry NEUROLOGIC:  Alert and oriented x 3 PSYCHIATRIC:  Normal affect     Signed, Norman HerrlichBrian , MD  12/21/2018 10:14 AM    [facility]

## 2018-12-21 ENCOUNTER — Ambulatory Visit (INDEPENDENT_AMBULATORY_CARE_PROVIDER_SITE_OTHER): Payer: Managed Care, Other (non HMO) | Admitting: Cardiology

## 2018-12-21 ENCOUNTER — Encounter: Payer: Self-pay | Admitting: Cardiology

## 2018-12-21 ENCOUNTER — Other Ambulatory Visit: Payer: Self-pay

## 2018-12-21 VITALS — BP 128/78 | HR 73 | Temp 97.5°F | Ht 71.0 in | Wt 212.8 lb

## 2018-12-21 DIAGNOSIS — I10 Essential (primary) hypertension: Secondary | ICD-10-CM

## 2018-12-21 DIAGNOSIS — R072 Precordial pain: Secondary | ICD-10-CM

## 2018-12-21 DIAGNOSIS — E785 Hyperlipidemia, unspecified: Secondary | ICD-10-CM

## 2018-12-21 DIAGNOSIS — Z86718 Personal history of other venous thrombosis and embolism: Secondary | ICD-10-CM | POA: Diagnosis not present

## 2018-12-21 DIAGNOSIS — I700 Atherosclerosis of aorta: Secondary | ICD-10-CM | POA: Diagnosis not present

## 2018-12-21 DIAGNOSIS — R079 Chest pain, unspecified: Secondary | ICD-10-CM

## 2018-12-21 DIAGNOSIS — Z86711 Personal history of pulmonary embolism: Secondary | ICD-10-CM | POA: Diagnosis not present

## 2018-12-21 DIAGNOSIS — Z01812 Encounter for preprocedural laboratory examination: Secondary | ICD-10-CM

## 2018-12-21 MED ORDER — ASPIRIN EC 81 MG PO TBEC: 81.0000 mg | DELAYED_RELEASE_TABLET | Freq: Every day | ORAL | 3 refills | Status: AC

## 2018-12-21 MED ORDER — METOPROLOL TARTRATE 50 MG PO TABS: 100.0000 mg | ORAL_TABLET | Freq: Once | ORAL | 0 refills | Status: AC

## 2018-12-21 NOTE — Patient Instructions (Addendum)
Medication Instructions:  Your physician has recommended you make the following change in your medication:   START aspirin 81 mg: Take 1 tablet daily  If you need a refill on your cardiac medications before your next appointment, please call your pharmacy.   Lab work: Your physician recommends that you return for lab work within 3-7 days before your cardiac CTA: BMP. Please return to our office for lab work, no appointment needed. No need to fast beforehand.   If you have labs (blood work) drawn today and your tests are completely normal, you will receive your results only by: Marland Kitchen MyChart Message (if you have MyChart) OR . A paper copy in the mail If you have any lab test that is abnormal or we need to change your treatment, we will call you to review the results.  Testing/Procedures: Your physician has requested that you have cardiac CT. Cardiac computed tomography (CT) is a painless test that uses an x-ray machine to take clear, detailed pictures of your heart. For further information please visit [facility]ugeFiesta[facility]tn. Please follow instruction sheet as given.   [facility] [place], [place] [address] [phone]  If scheduled at [facility], please arrive at the [facility] main entrance of [facility] 30-45 minutes prior to test start time. Proceed to the [facility] Radiology Department (first floor) to check-in and test prep.  Please follow these instructions carefully (unless otherwise directed):  Hold all erectile dysfunction medications at least 3 days (72 hrs) prior to test.  On the Night Before the Test: . Be sure to Drink plenty of water. . Do not consume any caffeinated/decaffeinated beverages or chocolate 12 hours prior to your test. . Do not take any antihistamines 12 hours prior to your test.  On the Day of the Test: . Drink plenty of water. Do not drink any water within one hour of the test. . Do not eat any food 4 hours  prior to the test. . You may take your regular medications prior to the test.  . Take metoprolol (Lopressor) two hours prior to test.                  -If HR is less than 55 BPM- No Beta Blocker                -IF HR is greater than 55 BPM and patient is less than or equal to 72 yrs old Lopressor 100mg  x1.                -If HR is greater than 55 BPM and patient is greater than 41 yrs old Lopressor 50 mg x1.          After the Test: . Drink plenty of water. . After receiving IV contrast, you may experience a mild flushed feeling. This is normal. . On occasion, you may experience a mild rash up to 24 hours after the test. This is not dangerous. If this occurs, you can take Benadryl 25 mg and increase your fluid intake. . If you experience trouble breathing, this can be serious. If it is severe call 911 IMMEDIATELY. If it is mild, please call our office.    Please contact the cardiac imaging nurse navigator should you have any questions/concerns Marchia Bond, RN Navigator Cardiac Imaging [facility] [phone] Office  [phone] Cell    Follow-Up: At [facility], you and your health needs are our priority.  As  part of our continuing mission to provide you with exceptional heart care, we have created designated Provider Care Teams.  These Care Teams include your primary Cardiologist (physician) and Advanced Practice Providers (APPs -  Physician Assistants and Nurse Practitioners) who all work together to provide you with the care you need, when you need it. You will need a follow up appointment in 6 weeks.        Aspirin and Your Heart  Aspirin is a medicine that prevents the cells in the blood that are used for clotting, called platelets, from sticking together. Aspirin can be used to help reduce the risk of blood clots, heart attacks, and other heart-related problems. Can I take aspirin? Your health care provider will help you determine whether  it is safe and beneficial for you to take aspirin daily. Taking aspirin daily may be helpful if you:  Have had a heart attack or chest pain.  Are at risk for a heart attack.  Have undergone open-heart surgery, such as coronary artery bypass surgery (CABG).  Have had coronary angioplasty or a stent.  Have had certain types of stroke or transient ischemic attack (TIA).  Have peripheral artery disease (PAD).  Have chronic heart rhythm problems such as atrial fibrillation and cannot take an anticoagulant.  Have valve disease or have had surgery on a valve. What are the risks? Daily use of aspirin can cause side effects. Some of these include:  Bleeding. Bleeding problems can be minor or serious. An example of a minor problem is a cut that does not stop bleeding. An example of a more serious problem is stomach bleeding or, rarely, bleeding into the brain. Your risk of bleeding is increased if you are also taking non-steroidal anti-inflammatory drugs (NSAIDs).  Increased bruising.  Upset stomach.  An allergic reaction. People who have nasal polyps have an increased risk of developing an aspirin allergy. General guidelines  Take aspirin only as told by your health care provider. Make sure that you understand how much you should take and what form you should take. The two forms of aspirin are: ? Non-enteric-coated.This type of aspirin does not have a coating and is absorbed quickly. This type of aspirin also comes in a chewable form. ? Enteric-coated. This type of aspirin has a coating that releases the medicine very slowly. Enteric-coated aspirin might cause less stomach upset than non-enteric-coated aspirin. This type of aspirin should not be chewed or crushed.  Limit alcohol intake to no more than 1 drink a day for nonpregnant women and 2 drinks a day for men. Drinking alcohol increases your risk of bleeding. One drink equals 12 oz of beer, 5 oz of wine, or 1 oz of hard liquor. Contact  a health care provider if you:  Have unusual bleeding or bruising.  Have stomach pain or nausea.  Have ringing in your ears.  Have an allergic reaction that causes: ? Hives. ? Itchy skin. ? Swelling of the lips, tongue, or face. Get help right away if you:  Notice that your bowel movements are bloody, dark red, or black in color.  Vomit or cough up blood.  Have blood in your urine.  Cough, have noisy breathing (wheeze), or feel short of breath.  Have chest pain, especially if the pain spreads to the arms, back, neck, or jaw.  Have a severe headache, or a headache with confusion, or dizziness. These symptoms may represent a serious problem that is an emergency. Do not wait to see if the  symptoms will go away. Get medical help right away. Call your local emergency services (911 in the [place] Do not drive yourself to the hospital. Summary  Aspirin can be used to help reduce the risk of blood clots, heart attacks, and other heart-related problems.  Daily use of aspirin can increase your risk of side effects. Your health care provider will help you determine whether it is safe and beneficial for you to take aspirin daily.  Take aspirin only as told by your health care provider. Make sure that you understand how much you can take and what form you can take. This information is not intended to replace advice given to you by your health care provider. Make sure you discuss any questions you have with your health care provider. Document Released: 02/22/2008 Document Revised: 01/09/2017 Document Reviewed: 01/09/2017 Elsevier Patient Education  2020 Elsevier Inc.     Cardiac CT Angiogram  A cardiac CT angiogram is a procedure to look at the heart and the area around the heart. It may be done to help find the cause of chest pains or other symptoms of heart disease. During this procedure, a large X-ray machine, called a CT scanner, takes detailed pictures of the heart and the  surrounding area after a dye (contrast material) has been injected into blood vessels in the area. The procedure is also sometimes called a coronary CT angiogram, coronary artery scanning, or CTA. A cardiac CT angiogram allows the health care provider to see how well blood is flowing to and from the heart. The health care provider will be able to see if there are any problems, such as:  Blockage or narrowing of the coronary arteries in the heart.  Fluid around the heart.  Signs of weakness or disease in the muscles, valves, and tissues of the heart. Tell a health care provider about:  Any allergies you have. This is especially important if you have had a previous allergic reaction to contrast dye.  All medicines you are taking, including vitamins, herbs, eye drops, creams, and over-the-counter medicines.  Any blood disorders you have.  Any surgeries you have had.  Any medical conditions you have.  Whether you are pregnant or may be pregnant.  Any anxiety disorders, chronic pain, or other conditions you have that may increase your stress or prevent you from lying still. What are the risks? Generally, this is a safe procedure. However, problems may occur, including:  Bleeding.  Infection.  Allergic reactions to medicines or dyes.  Damage to other structures or organs.  Kidney damage from the dye or contrast that is used.  Increased risk of cancer from radiation exposure. This risk is low. Talk with your health care provider about: ? The risks and benefits of testing. ? How you can receive the lowest dose of radiation. What happens before the procedure?  Wear comfortable clothing and remove any jewelry, glasses, dentures, and hearing aids.  Follow instructions from your health care provider about eating and drinking. This may include: ? For 12 hours before the test - avoid caffeine. This includes tea, coffee, soda, energy drinks, and diet pills. Drink plenty of water or other  fluids that do not have caffeine in them. Being well-hydrated can prevent complications. ? For 4-6 hours before the test - stop eating and drinking. The contrast dye can cause nausea, but this is less likely if your stomach is empty.  Ask your health care provider about changing or stopping your regular medicines. This is especially important  if you are taking diabetes medicines, blood thinners, or medicines to treat erectile dysfunction. What happens during the procedure?  Hair on your chest may need to be removed so that small sticky patches called electrodes can be placed on your chest. These will transmit information that helps to monitor your heart during the test.  An IV tube will be inserted into one of your veins.  You might be given a medicine to control your heart rate during the test. This will help to ensure that good images are obtained.  You will be asked to lie on an exam table. This table will slide in and out of the CT machine during the procedure.  Contrast dye will be injected into the IV tube. You might feel warm, or you may get a metallic taste in your mouth.  You will be given a medicine (nitroglycerin) to relax (dilate) the arteries in your heart.  The table that you are lying on will move into the CT machine tunnel for the scan.  The person running the machine will give you instructions while the scans are being done. You may be asked to: ? Keep your arms above your head. ? Hold your breath. ? Stay very still, even if the table is moving.  When the scanning is complete, you will be moved out of the machine.  The IV tube will be removed. The procedure may vary among health care providers and hospitals. What happens after the procedure?  You might feel warm, or you may get a metallic taste in your mouth from the contrast dye.  You may have a headache from the nitroglycerin.  After the procedure, drink water or other fluids to wash (flush) the contrast material  out of your body.  Contact a health care provider if you have any symptoms of allergy to the contrast. These symptoms include: ? Shortness of breath. ? Rash or hives. ? A racing heartbeat.  Most people can return to their normal activities right after the procedure. Ask your health care provider what activities are safe for you.  It is up to you to get the results of your procedure. Ask your health care provider, or the department that is doing the procedure, when your results will be ready. Summary  A cardiac CT angiogram is a procedure to look at the heart and the area around the heart. It may be done to help find the cause of chest pains or other symptoms of heart disease.  During this procedure, a large X-ray machine, called a CT scanner, takes detailed pictures of the heart and the surrounding area after a dye (contrast material) has been injected into blood vessels in the area.  Ask your health care provider about changing or stopping your regular medicines before the procedure. This is especially important if you are taking diabetes medicines, blood thinners, or medicines to treat erectile dysfunction.  After the procedure, drink water or other fluids to wash (flush) the contrast material out of your body. This information is not intended to replace advice given to you by your health care provider. Make sure you discuss any questions you have with your health care provider. Document Released: 02/22/2008 Document Revised: 02/21/2017 Document Reviewed: 01/29/2016 Elsevier Patient Education  2020 [employer].

## 2019-01-27 ENCOUNTER — Telehealth (HOSPITAL_COMMUNITY): Payer: Self-pay | Admitting: Emergency Medicine

## 2019-01-27 NOTE — Telephone Encounter (Signed)
Pt requesting to reschedule cardiac CT, did not go into detail as to why. Will route to scheduling team and ordering provider

## 2019-01-28 ENCOUNTER — Ambulatory Visit (HOSPITAL_COMMUNITY): Admission: RE | Admit: 2019-01-28 | Payer: Managed Care, Other (non HMO) | Source: Ambulatory Visit

## 2019-02-09 ENCOUNTER — Other Ambulatory Visit: Payer: Self-pay

## 2019-02-09 MED ORDER — AMLODIPINE BESYLATE 5 MG PO TABS: 5.0000 mg | ORAL_TABLET | Freq: Every day | ORAL | 0 refills | Status: DC

## 2019-02-12 ENCOUNTER — Telehealth: Payer: Self-pay

## 2019-02-12 NOTE — Telephone Encounter (Signed)
Copied from [place] [id number]. Topic: General - Call Back - No Documentation >> Feb 12, 2019  3:03 PM Erick Blinks wrote: Reason for CRM: Pt is requesting a call back, pt has received mail for pt named Derek Stevenson that was mailed to his address. Requesting call back from management, Best contact: [phone]

## 2019-02-15 ENCOUNTER — Ambulatory Visit: Payer: Managed Care, Other (non HMO) | Admitting: Cardiology

## 2019-02-15 NOTE — Progress Notes (Deleted)
Cardiology Office Note:    Date:  02/15/2019   ID:  Derek Stevenson, DOB Sep 21, 1965, MRN [id number]  PCP:  Esperanza Richters, PA-C  Cardiologist:  Norman Herrlich, MD    Referring MD: Esperanza Richters, PA-C    ASSESSMENT:    No diagnosis found. PLAN:    In order of problems listed above:  1. ***   Next appointment: ***   Medication Adjustments/Labs and Tests Ordered: Current medicines are reviewed at length with the patient today.  Concerns regarding medicines are outlined above.  No orders of the defined types were placed in this encounter.  No orders of the defined types were placed in this encounter.   No chief complaint on file.   History of Present Illness:    Derek Stevenson is a 53 y.o. male with a hx of hypertension and aortic atherosclerosis and a history of venous thromboembolism in August 2017 with acute deep vein thrombosis of the tibial vein right lower extremity with pulmonary embolism associated with lower extremity trauma and fracture   He was last seen 12/21/2018 for chest pain. Compliance with diet, lifestyle and medications: *** Past Medical History:  Diagnosis Date  . Acute deep vein thrombosis (DVT) of tibial vein of right lower extremity (HCC) 10/25/2015  . Chest pain 12/18/2018  . Diverticulitis    temporary colostomy 2001  . DVT (deep venous thrombosis) (HCC) 2017   pt unsure of details  . PE (pulmonary thromboembolism) (HCC) 10/25/2015   July 2017  . Pulmonary embolism and infarction Derek Stevenson) 2017   pt unsure of details    Past Surgical History:  Procedure Laterality Date  . COLON SURGERY    . KNEE SURGERY     "reconstructive"    Current Medications: No outpatient medications have been marked as taking for the 02/15/19 encounter (Appointment) with Baldo Daub, MD.     Allergies:   Penicillins   Social History   Socioeconomic History  . Marital status: Single    Spouse name: Not on file  . Number of children: Not on file  . Years of  education: Not on file  . Highest education level: Not on file  Occupational History  . Not on file  Social Needs  . Financial resource strain: Not on file  . Food insecurity    Worry: Not on file    Inability: Not on file  . Transportation needs    Medical: Not on file    Non-medical: Not on file  Tobacco Use  . Smoking status: Current Every Day Smoker    Packs/day: 0.50    Years: 15.00    Pack years: 7.50    Types: Cigarettes  . Smokeless tobacco: Never Used  Substance and Sexual Activity  . Alcohol use: Yes    Comment: 4 -5 drinks per week   . Drug use: No  . Sexual activity: Not on file  Lifestyle  . Physical activity    Days per week: Not on file    Minutes per session: Not on file  . Stress: Not on file  Relationships  . Social Musician on phone: Not on file    Gets together: Not on file    Attends religious service: Not on file    Active member of club or organization: Not on file    Attends meetings of clubs or organizations: Not on file    Relationship status: Not on file  Other Topics Concern  . Not on  file  Social History Narrative  . Not on file     Family History: The patient's ***family history includes Cancer in his maternal grandfather, maternal grandmother, paternal grandfather, and paternal grandmother; Heart attack in his mother; Heart disease in his father and mother; Heart failure in his father; Hypertension in his brother. ROS:   Please see the history of present illness.    All other systems reviewed and are negative.  EKGs/Labs/Other Studies Reviewed:    The following studies were reviewed today:  EKG:  EKG ordered today and personally reviewed.  The ekg ordered today demonstrates ***  Recent Labs: 12/14/2018: ALT 21; BUN 12; Creatinine, Ser 0.78; Hemoglobin 14.5; Platelets 262.0; Potassium 4.4; Sodium 139  Recent Lipid Panel    Component Value Date/Time   CHOL 166 12/14/2018 1150   TRIG 102.0 12/14/2018 1150   HDL 32.10  (L) 12/14/2018 1150   CHOLHDL 5 12/14/2018 1150   VLDL 20.4 12/14/2018 1150   LDLCALC 114 (H) 12/14/2018 1150   LDLDIRECT 130.3 07/13/2012 1512    Physical Exam:    VS:  There were no vitals taken for this visit.    Wt Readings from Last 3 Encounters:  12/21/18 212 lb 12.8 oz (96.5 kg)  12/14/18 211 lb 9.6 oz (96 kg)  12/01/15 209 lb (94.8 kg)     GEN: *** Well nourished, well developed in no acute distress HEENT: Normal NECK: No JVD; No carotid bruits LYMPHATICS: No lymphadenopathy CARDIAC: ***RRR, no murmurs, rubs, gallops RESPIRATORY:  Clear to auscultation without rales, wheezing or rhonchi  ABDOMEN: Soft, non-tender, non-distended MUSCULOSKELETAL:  No edema; No deformity  SKIN: Warm and dry NEUROLOGIC:  Alert and oriented x 3 PSYCHIATRIC:  Normal affect    Signed, Shirlee More, MD  02/15/2019 7:59 AM    [place]

## 2019-03-18 ENCOUNTER — Other Ambulatory Visit: Payer: Self-pay | Admitting: Medical

## 2019-04-05 ENCOUNTER — Ambulatory Visit (HOSPITAL_COMMUNITY): Payer: Managed Care, Other (non HMO)

## 2019-04-09 ENCOUNTER — Ambulatory Visit: Payer: Managed Care, Other (non HMO) | Admitting: Cardiology

## 2021-04-03 ENCOUNTER — Telehealth: Payer: Self-pay

## 2021-04-03 NOTE — Telephone Encounter (Addendum)
Spoke with patients brother, Pieter Partridge.  Pieter Partridge reports patient had recent complaints of chest pain/pressure lasting greater than 1 week. He states patient had h/o high blood pressure and was a heavy smoker. He had not followed up with a provider d/t no insurance.  Pieter Partridge found patient at home on Saturday, 2021-04-19, sitting in chair with arms crossed. Patient was cold and without vital signs when he arrived.  [place] called 911, EMS/HPPD pronounced patient.   Spoke with [facility], patient is to cremated therefore would appreciate signature of death certificate as soon as possible.   Filled out death certificate based on information given. Specified approximate date and time as that was not given to me.  Mackie Pai, PA-C

## 2021-04-25 DEATH — deceased
